# Patient Record
Sex: Female | Born: 1985 | Race: White | Hispanic: Yes | Smoking: Current some day smoker
Health system: Southern US, Community
[De-identification: ages and names within clinical notes are randomized; demographics above are authoritative.]

---

## 2018-05-26 ENCOUNTER — Encounter (HOSPITAL_COMMUNITY): Payer: Self-pay | Admitting: Emergency Medicine

## 2018-05-26 ENCOUNTER — Emergency Department (HOSPITAL_COMMUNITY)
Admission: EM | Admit: 2018-05-26 | Discharge: 2018-05-27 | Disposition: A | Discharge status: Home / Self Care | Payer: Medicaid Other | Attending: Emergency Medicine | Admitting: Emergency Medicine

## 2018-05-26 DIAGNOSIS — R55 Syncope and collapse: Secondary | ICD-10-CM | POA: Insufficient documentation

## 2018-05-26 DIAGNOSIS — Y9389 Activity, other specified: Secondary | ICD-10-CM | POA: Insufficient documentation

## 2018-05-26 DIAGNOSIS — Y999 Unspecified external cause status: Secondary | ICD-10-CM | POA: Diagnosis not present

## 2018-05-26 DIAGNOSIS — Y929 Unspecified place or not applicable: Secondary | ICD-10-CM | POA: Insufficient documentation

## 2018-05-26 DIAGNOSIS — M542 Cervicalgia: Secondary | ICD-10-CM | POA: Diagnosis present

## 2018-05-26 LAB — BASIC METABOLIC PANEL
Anion gap: 7 (ref 5–15)
BUN: 11 mg/dL (ref 6–20)
CHLORIDE: 107 mmol/L (ref 98–111)
CO2: 24 mmol/L (ref 22–32)
Calcium: 9.2 mg/dL (ref 8.9–10.3)
Creatinine, Ser: 0.61 mg/dL (ref 0.44–1.00)
GFR calc Af Amer: >60 mL/min (ref >60)
GFR calc non Af Amer: >60 mL/min (ref >60)
GLUCOSE: 92 mg/dL (ref 70–99)
POTASSIUM: 4.2 mmol/L (ref 3.5–5.1)
Sodium: 138 mmol/L (ref 135–145)

## 2018-05-26 LAB — CBC WITH DIFFERENTIAL/PLATELET
Abs Immature Granulocytes: 0 10*3/uL (ref 0.0–0.1)
Basophils Absolute: 0 10*3/uL (ref 0.0–0.1)
Basophils Relative: 0 %
EOS PCT: 1 %
Eosinophils Absolute: 0 10*3/uL (ref 0.0–0.7)
HEMATOCRIT: 40.6 % (ref 36.0–46.0)
HEMOGLOBIN: 13.3 g/dL (ref 12.0–15.0)
Immature Granulocytes: 0 %
LYMPHS ABS: 2.6 10*3/uL (ref 0.7–4.0)
Lymphocytes Relative: 29 %
MCH: 32.2 pg (ref 26.0–34.0)
MCHC: 32.8 g/dL (ref 30.0–36.0)
MCV: 98.3 fL (ref 78.0–100.0)
Monocytes Absolute: 0.8 10*3/uL (ref 0.1–1.0)
Monocytes Relative: 9 %
NEUTROS ABS: 5.3 10*3/uL (ref 1.7–7.7)
Neutrophils Relative %: 61 %
Platelets: 220 10*3/uL (ref 150–400)
RBC: 4.13 MIL/uL (ref 3.87–5.11)
RDW: 13.4 % (ref 11.5–15.5)
WBC: 8.7 10*3/uL (ref 4.0–10.5)

## 2018-05-26 LAB — I-STAT BETA HCG BLOOD, ED (MC, WL, AP ONLY): I-stat hCG, quantitative: <5 m[IU]/mL (ref <5)
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
## 2018-05-27 ENCOUNTER — Emergency Department (HOSPITAL_COMMUNITY): Payer: Medicaid Other

## 2018-05-27 NOTE — Discharge Instructions (Signed)
Return to ED for worsening symptoms, severe neck pain, headache, chest pain, loss of consciousness, trouble breathing or trouble swallowing.

## 2018-12-29 ENCOUNTER — Ambulatory Visit (HOSPITAL_COMMUNITY)
Admission: EM | Admit: 2018-12-29 | Discharge: 2018-12-29 | Discharge status: Against Medical Advice | Payer: Medicaid Other

## 2018-12-29 NOTE — ED Notes (Signed)
Per pt access pt lwbs 

## 2019-11-17 ENCOUNTER — Emergency Department (HOSPITAL_COMMUNITY): Payer: Medicaid Other

## 2019-11-17 ENCOUNTER — Ambulatory Visit (HOSPITAL_COMMUNITY)
Admission: EM | Admit: 2019-11-17 | Discharge: 2019-11-17 | Disposition: A | Discharge status: Home / Self Care | Payer: Medicaid Other

## 2019-11-17 ENCOUNTER — Other Ambulatory Visit: Payer: Self-pay

## 2019-11-17 ENCOUNTER — Encounter (HOSPITAL_COMMUNITY): Payer: Self-pay | Admitting: Emergency Medicine

## 2019-11-17 ENCOUNTER — Emergency Department (HOSPITAL_COMMUNITY)
Admission: EM | Admit: 2019-11-17 | Discharge: 2019-11-17 | Disposition: A | Discharge status: Home / Self Care | Payer: Medicaid Other | Attending: Emergency Medicine | Admitting: Emergency Medicine

## 2019-11-17 ENCOUNTER — Encounter (HOSPITAL_COMMUNITY): Payer: Self-pay

## 2019-11-17 DIAGNOSIS — S0993XA Unspecified injury of face, initial encounter: Secondary | ICD-10-CM

## 2019-11-17 DIAGNOSIS — J349 Unspecified disorder of nose and nasal sinuses: Secondary | ICD-10-CM | POA: Insufficient documentation

## 2019-11-17 DIAGNOSIS — F17290 Nicotine dependence, other tobacco product, uncomplicated: Secondary | ICD-10-CM | POA: Diagnosis not present

## 2019-11-17 DIAGNOSIS — S0012XA Contusion of left eyelid and periocular area, initial encounter: Secondary | ICD-10-CM | POA: Diagnosis not present

## 2019-11-17 DIAGNOSIS — R42 Dizziness and giddiness: Secondary | ICD-10-CM | POA: Diagnosis not present

## 2019-11-17 DIAGNOSIS — S0592XA Unspecified injury of left eye and orbit, initial encounter: Secondary | ICD-10-CM | POA: Diagnosis present

## 2019-11-17 DIAGNOSIS — S022XXA Fracture of nasal bones, initial encounter for closed fracture: Secondary | ICD-10-CM | POA: Diagnosis not present

## 2019-11-17 DIAGNOSIS — J3489 Other specified disorders of nose and nasal sinuses: Secondary | ICD-10-CM

## 2019-11-17 DIAGNOSIS — R519 Headache, unspecified: Secondary | ICD-10-CM | POA: Insufficient documentation

## 2019-11-17 DIAGNOSIS — Y999 Unspecified external cause status: Secondary | ICD-10-CM | POA: Insufficient documentation

## 2019-11-17 DIAGNOSIS — Y929 Unspecified place or not applicable: Secondary | ICD-10-CM | POA: Diagnosis not present

## 2019-11-17 DIAGNOSIS — Y9389 Activity, other specified: Secondary | ICD-10-CM | POA: Insufficient documentation

## 2019-11-17 MED ORDER — ONDANSETRON HCL 4 MG PO TABS
4.0000 mg | ORAL_TABLET | Freq: Once | ORAL | Status: AC
  Administered 2019-11-17: 4 mg via ORAL
  Filled 2019-11-17: qty 1

## 2019-11-17 MED ORDER — TETRACAINE HCL 0.5 % OP SOLN
2.0000 [drp] | Freq: Once | OPHTHALMIC | Status: AC
  Administered 2019-11-17: 2 [drp] via OPHTHALMIC
  Filled 2019-11-17: qty 4

## 2019-11-17 MED ORDER — ONDANSETRON HCL 4 MG PO TABS: 4.0000 mg | ORAL_TABLET | Freq: Three times a day (TID) | ORAL | 0 refills | Status: AC | PRN

## 2019-11-17 MED ORDER — OXYCODONE-ACETAMINOPHEN 5-325 MG PO TABS: 1.0000 | ORAL_TABLET | Freq: Four times a day (QID) | ORAL | 0 refills | Status: AC | PRN

## 2019-11-17 MED ORDER — MECLIZINE HCL 25 MG PO TABS
25.0000 mg | ORAL_TABLET | Freq: Three times a day (TID) | ORAL | 0 refills | Status: AC | PRN
Start: 2019-11-17 — End: ?

## 2019-11-17 MED ORDER — FLUORESCEIN SODIUM 1 MG OP STRP
1.0000 | ORAL_STRIP | Freq: Once | OPHTHALMIC | Status: AC
  Administered 2019-11-17: 1 via OPHTHALMIC
  Filled 2019-11-17: qty 1

## 2019-11-17 MED ORDER — AFRIN NASAL SPRAY 0.05 % NA SOLN: 1.0000 | Freq: Two times a day (BID) | NASAL | 0 refills | Status: AC

## 2019-11-17 MED ORDER — ONDANSETRON HCL 4 MG/2ML IJ SOLN: 4.0000 mg | Freq: Once | INTRAMUSCULAR | Status: DC

## 2019-11-17 MED ORDER — OXYCODONE-ACETAMINOPHEN 5-325 MG PO TABS
1.0000 | ORAL_TABLET | Freq: Once | ORAL | Status: AC
  Administered 2019-11-17: 1 via ORAL
  Filled 2019-11-17: qty 1
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
## 2019-11-17 NOTE — ED Notes (Signed)
Visual acuity completed @ urgent care. Per note pt normally wears corrective lenses but does not have them for exam.  L 20/70 R 20/50 B 20/40
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
## 2019-11-17 NOTE — Discharge Instructions (Addendum)
Get help right away if: You have bleeding from your nose that does not stop after you pinch your nostrils closed for 20 minutes and keep ice on your nose. You have clear fluid draining out of your nose. You notice swelling near the septum inside the nose. This swelling is a septal hematoma that must be drained to help prevent infection. You have difficulty moving your eyes. You have repeated vomiting.
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
## 2019-11-17 NOTE — ED Notes (Signed)
Pt normally wears corrective lenses for distance vision, but did not bring them with her today. Visual acuity completed.
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
## 2020-03-23 ENCOUNTER — Ambulatory Visit: Payer: Medicaid Other | Attending: Internal Medicine

## 2020-03-23 DIAGNOSIS — Z23 Encounter for immunization: Secondary | ICD-10-CM

## 2020-03-23 NOTE — Progress Notes (Signed)
   Covid-19 Vaccination Clinic  Name:  Reaghan Kawa    MRN: [id number] DOB: Oct 12, 1986  03/23/2020  Ms. Muraoka was observed post Covid-19 immunization for 15 minutes without incident. She was provided with Vaccine Information Sheet and instruction to access the V-Safe system.   Ms. Fichter was instructed to call 911 with any severe reactions post vaccine: Marland Kitchen Difficulty breathing  . Swelling of face and throat  . A fast heartbeat  . A bad rash all over body  . Dizziness and weakness   Immunizations Administered    Name Date Dose VIS Date Route   Pfizer COVID-19 Vaccine 03/23/2020 11:06 AM 0.3 mL 12/22/2018 Intramuscular   Manufacturer: [employer], [employer]   Lot: [id number]   NDC: [address]-4

## 2020-04-17 ENCOUNTER — Ambulatory Visit: Payer: Medicaid Other | Attending: Internal Medicine

## 2020-04-17 DIAGNOSIS — Z23 Encounter for immunization: Secondary | ICD-10-CM

## 2020-04-17 NOTE — Progress Notes (Signed)
   Covid-19 Vaccination Clinic  Name:  Maria Spence    MRN: [id number] DOB: 1986-01-24  04/17/2020  [name] was observed post Covid-19 immunization for 15 minutes without incident. She was provided with Vaccine Information Sheet and instruction to access the V-Safe system.   [name] was instructed to call 911 with any severe reactions post vaccine: Marland Kitchen Difficulty breathing  . Swelling of face and throat  . A fast heartbeat  . A bad rash all over body  . Dizziness and weakness   Immunizations Administered    Name Date Dose VIS Date Route   Pfizer COVID-19 Vaccine 04/17/2020 11:08 AM 0.3 mL 12/22/2018 Intramuscular   Manufacturer: [employer], [employer]   Lot: [id number]   NDC: [address]-1

## 2020-12-31 ENCOUNTER — Encounter (HOSPITAL_COMMUNITY): Payer: Self-pay | Admitting: Emergency Medicine

## 2020-12-31 ENCOUNTER — Emergency Department (HOSPITAL_COMMUNITY)
Admission: EM | Admit: 2020-12-31 | Discharge: 2020-12-31 | Disposition: A | Discharge status: Home / Self Care | Payer: Medicaid Other | Attending: Emergency Medicine | Admitting: Emergency Medicine

## 2020-12-31 ENCOUNTER — Other Ambulatory Visit: Payer: Self-pay

## 2020-12-31 ENCOUNTER — Emergency Department (HOSPITAL_COMMUNITY): Payer: Medicaid Other

## 2020-12-31 DIAGNOSIS — W06XXXA Fall from bed, initial encounter: Secondary | ICD-10-CM | POA: Insufficient documentation

## 2020-12-31 DIAGNOSIS — S80211A Abrasion, right knee, initial encounter: Secondary | ICD-10-CM | POA: Diagnosis not present

## 2020-12-31 DIAGNOSIS — F17290 Nicotine dependence, other tobacco product, uncomplicated: Secondary | ICD-10-CM | POA: Diagnosis not present

## 2020-12-31 DIAGNOSIS — W19XXXA Unspecified fall, initial encounter: Secondary | ICD-10-CM

## 2020-12-31 DIAGNOSIS — Z23 Encounter for immunization: Secondary | ICD-10-CM | POA: Insufficient documentation

## 2020-12-31 DIAGNOSIS — S8991XA Unspecified injury of right lower leg, initial encounter: Secondary | ICD-10-CM | POA: Diagnosis present

## 2020-12-31 DIAGNOSIS — M25561 Pain in right knee: Secondary | ICD-10-CM

## 2020-12-31 MED ORDER — HYDROCODONE-ACETAMINOPHEN 5-325 MG PO TABS
1.0000 | ORAL_TABLET | Freq: Once | ORAL | Status: AC
  Administered 2020-12-31: 1 via ORAL
  Filled 2020-12-31: qty 1

## 2020-12-31 MED ORDER — TETANUS-DIPHTH-ACELL PERTUSSIS 5-2.5-18.5 LF-MCG/0.5 IM SUSY
0.5000 mL | PREFILLED_SYRINGE | Freq: Once | INTRAMUSCULAR | Status: AC
  Administered 2020-12-31: 0.5 mL via INTRAMUSCULAR
  Filled 2020-12-31: qty 0.5

## 2020-12-31 NOTE — ED Triage Notes (Signed)
Pt tripped and fell when she got out of bed and hit R knee on metal.  C/o R knee pain.

## 2020-12-31 NOTE — Progress Notes (Signed)
Orthopedic Tech Progress Note Patient Details:  Maria Spence 1986-07-30 [id number]  Ortho Devices Type of Ortho Device: Knee Sleeve,Crutches Ortho Device/Splint Location: Right Lower Extremity Ortho Device/Splint Interventions: Ordered,Application,Adjustment   Post Interventions Patient Tolerated: Well Instructions Provided: Poper ambulation with device,Adjustment of device,Care of device   Maria Spence P Harle Stanford 12/31/2020, 2:25 PM

## 2020-12-31 NOTE — Discharge Instructions (Signed)
It was our pleasure taking care of you here in the emergency department today  X-ray did not show any evidence of broken bones or dislocation  Take Tylenol and ibuprofen as needed for pain.  Make sure to elevate and place ice on your knee.  If you still unable to walk after a few days without crutches follow-up with orthopedics.  Their contact information is listed in your discharge paperwork.  Call them to schedule appointment.  Return for any worsening symptoms

## 2020-12-31 NOTE — ED Provider Notes (Signed)
[facility] EMERGENCY DEPARTMENT Provider Note   CSN: [id number] Arrival date & time: 12/31/20  1012     History Chief Complaint  Patient presents with  . Knee Pain    Maria Spence is a 35 y.o. female with past medical history who presents for evaluation of right knee pain.  Tripped and fell getting out of bed.  Denies hitting head, LOC or anticoagulation.  Hit lateral, anterior aspect knee on a metal object.  Has approximately 3 mm abrasion to anterior knee.  Has some overlying swelling.  She has pain when she walks.  No fever, chills, nausea vomiting, chest pain, shortness of breath, paresthesias or weakness.  Rates her current pain a 9/10.  Denies additional aggravating or alleviating factors.  History obtained from patient and past medical records.  No interpreter used.  HPI     History reviewed. No pertinent past medical history.  There are no problems to display for this patient.   History reviewed. No pertinent surgical history.   OB History   No obstetric history on file.     Family History  Problem Relation Age of Onset  . Diabetes Mother   . Hypertension Mother   . Diabetes Father     Social History   Tobacco Use  . Smoking status: Current Some Day Smoker    Types: Cigars  . Smokeless tobacco: Never Used  Vaping Use  . Vaping Use: Never used  Substance Use Topics  . Alcohol use: Yes    Comment: weekly  . Drug use: Never    Home Medications Prior to Admission medications   Medication Sig Start Date End Date Taking? Authorizing Provider  meclizine (ANTIVERT) 25 MG tablet Take 1 tablet (25 mg total) by mouth 3 (three) times daily as needed for dizziness. 11/17/19   Harris, Cammy Copa, PA-C  ondansetron (ZOFRAN) 4 MG tablet Take 1 tablet (4 mg total) by mouth every 8 (eight) hours as needed for nausea or vomiting. 11/17/19   Arthor Captain, PA-C  oxyCODONE-acetaminophen (PERCOCET) 5-325 MG tablet Take 1-2 tablets by mouth every 6 (six)  hours as needed for severe pain. 11/17/19   Arthor Captain, PA-C    Allergies    Patient has no known allergies.  Review of Systems   Review of Systems  Constitutional: Negative.   HENT: Negative.   Respiratory: Negative.   Cardiovascular: Negative.   Gastrointestinal: Negative.   Genitourinary: Negative.   Musculoskeletal: Positive for gait problem.       Right knee pain.  Walk with a limp secondary to pain  Skin: Positive for wound.  All other systems reviewed and are negative.   Physical Exam Updated Vital Signs BP 118/76 (BP Location: Right Arm)   Pulse 94   Temp 98.6 F (37 C) (Oral)   Resp 12   LMP 12/24/2020   SpO2 98%   Physical Exam Vitals and nursing note reviewed.  Constitutional:      General: She is not in acute distress.    Appearance: She is well-developed and well-nourished. She is not ill-appearing, toxic-appearing or diaphoretic.  HENT:     Head: Normocephalic and atraumatic.     Nose: Nose normal.     Mouth/Throat:     Mouth: Mucous membranes are moist.  Eyes:     Pupils: Pupils are equal, round, and reactive to light.  Cardiovascular:     Rate and Rhythm: Normal rate.     Pulses: Intact distal pulses.  Pulmonary:  Effort: Pulmonary effort is normal. No respiratory distress.     Comments: Speaks in full sentences without difficulty Abdominal:     General: Bowel sounds are normal. There is no distension.  Musculoskeletal:        General: Swelling, tenderness and signs of injury present. Normal range of motion.     Cervical back: Normal range of motion.     Comments: Tenderness, soft tissue swelling diffusely to right knee.  She is able to flex and extend without difficulty.  Able to straight leg raise.  No bony tenderness to bilateral femur, tib-fib.  No shortening or rotation of legs.  No obvious effusion.  Skin:    General: Skin is warm and dry.     Capillary Refill: Capillary refill takes less than 2 seconds.     Comments: Soft tissue  swelling diffusely to right anterior knee.  3 mm abrasion.  Neurological:     General: No focal deficit present.     Mental Status: She is alert and oriented to person, place, and time.     Comments: Intact sensation, equal strength Gait with limp secondary to pain  Psychiatric:        Mood and Affect: Mood and affect normal.     ED Results / Procedures / Treatments   Labs (all labs ordered are listed, but only abnormal results are displayed) Labs Reviewed - No data to display  EKG None  Radiology DG Knee Complete 4 Views Right  Result Date: 12/31/2020 CLINICAL DATA:  Fall EXAM: RIGHT KNEE - COMPLETE 4+ VIEW COMPARISON:  None. FINDINGS: No acute fracture or dislocation. Joint spaces and alignment are maintained. No area of erosion or osseous destruction. No unexpected radiopaque foreign body. Soft tissues are unremarkable. IMPRESSION: Negative. Electronically Signed   By: Meda Klinefelter MD   On: 12/31/2020 11:48    Procedures .Ortho Injury Treatment  Date/Time: 12/31/2020 12:47 PM Performed by: Linwood Dibbles, PA-C Authorized by: Linwood Dibbles, PA-C   Consent:    Consent obtained:  Verbal   Consent given by:  Patient   Risks discussed:  Fracture, nerve damage, restricted joint movement, vascular damage, stiffness, irreducible dislocation and recurrent dislocation   Alternatives discussed:  No treatment, alternative treatment, immobilization, referral and delayed treatmentInjury location: knee Location details: right knee Injury type: soft tissue Pre-procedure neurovascular assessment: neurovascularly intact Pre-procedure distal perfusion: normal Pre-procedure neurological function: normal Pre-procedure range of motion: normal  Anesthesia: Local anesthesia used: no  Patient sedated: NoImmobilization: splint and crutches     Medications Ordered in ED Medications  HYDROcodone-acetaminophen (NORCO/VICODIN) 5-325 MG per tablet 1 tablet (1 tablet Oral Given  12/31/20 1106)  Tdap (BOOSTRIX) injection 0.5 mL (0.5 mLs Intramuscular Given 12/31/20 1107)    ED Course  I have reviewed the triage vital signs and the nursing notes.  Pertinent labs & imaging results that were available during my care of the patient were reviewed by me and considered in my medical decision making (see chart for details).   35 year old here with right knee pain after getting out of bed and subsequently hitting on metal object at bedside.  She is afebrile, nonseptic, non-ill-appearing.  Diffuse tenderness to right knee.  She is neurovascularly intact.  Able to flex, extend, straight leg raise.  No bony tenderness of bilateral femur, tib-fib.  Does have a small, 3 mm abrasion.  Unknown last tetanus, will update.  We will plan on pain management, x-ray.  X-ray with  Low suspicion for septic joint,  gout, hemarthrosis, occult fracture, dislocation, myositis, rhabdomyolysis, VTE.  Will DC home with crutches, splint.  Discussed rotation Tylenol, ibuprofen, ice and elevation.  Follow with orthopedics for any worsening symptoms.  The patient has been appropriately medically screened and/or stabilized in the ED. I have low suspicion for any other emergent medical condition which would require further screening, evaluation or treatment in the ED or require inpatient management.  Patient is hemodynamically stable and in no acute distress.  Patient able to ambulate in department prior to ED.  Evaluation does not show acute pathology that would require ongoing or additional emergent interventions while in the emergency department or further inpatient treatment.  I have discussed the diagnosis with the patient and answered all questions.  Pain is been managed while in the emergency department and patient has no further complaints prior to discharge.  Patient is comfortable with plan discussed in room and is stable for discharge at this time.  I have discussed strict return precautions for returning  to the emergency department.  Patient was encouraged to follow-up with PCP/specialist refer to at discharge.    MDM Rules/Calculators/A&P                           Final Clinical Impression(s) / ED Diagnoses Final diagnoses:  Acute pain of right knee  Fall, initial encounter    Rx / DC Orders ED Discharge Orders    None       Inessa Wardrop A, PA-C 12/31/20 1248    Tegeler, Canary Brim, MD 12/31/20 [phone]

## 2021-08-07 ENCOUNTER — Ambulatory Visit: Payer: Self-pay

## 2021-08-27 ENCOUNTER — Emergency Department (HOSPITAL_COMMUNITY)
Admission: EM | Admit: 2021-08-27 | Discharge: 2021-08-28 | Disposition: A | Discharge status: Home / Self Care | Payer: Medicaid Other | Attending: Emergency Medicine | Admitting: Emergency Medicine

## 2021-08-27 ENCOUNTER — Other Ambulatory Visit: Payer: Self-pay

## 2021-08-27 DIAGNOSIS — N939 Abnormal uterine and vaginal bleeding, unspecified: Secondary | ICD-10-CM | POA: Diagnosis present

## 2021-08-27 DIAGNOSIS — N9489 Other specified conditions associated with female genital organs and menstrual cycle: Secondary | ICD-10-CM | POA: Diagnosis not present

## 2021-08-27 DIAGNOSIS — R1030 Lower abdominal pain, unspecified: Secondary | ICD-10-CM | POA: Insufficient documentation

## 2021-08-27 DIAGNOSIS — Y92410 Unspecified street and highway as the place of occurrence of the external cause: Secondary | ICD-10-CM | POA: Insufficient documentation

## 2021-08-27 DIAGNOSIS — F17290 Nicotine dependence, other tobacco product, uncomplicated: Secondary | ICD-10-CM | POA: Diagnosis not present

## 2021-08-27 LAB — COMPREHENSIVE METABOLIC PANEL
ALT: 30 U/L (ref 0–44)
AST: 21 U/L (ref 15–41)
Albumin: 3.8 g/dL (ref 3.5–5.0)
Alkaline Phosphatase: 81 U/L (ref 38–126)
Anion gap: 5 (ref 5–15)
BUN: 11 mg/dL (ref 6–20)
CO2: 24 mmol/L (ref 22–32)
Calcium: 8.9 mg/dL (ref 8.9–10.3)
Chloride: 109 mmol/L (ref 98–111)
Creatinine, Ser: 0.78 mg/dL (ref 0.44–1.00)
GFR, Estimated: >60 mL/min (ref >60)
Glucose, Bld: 67 mg/dL — ABNORMAL LOW (ref 70–99)
Potassium: 4.2 mmol/L (ref 3.5–5.1)
Sodium: 138 mmol/L (ref 135–145)
Total Bilirubin: 0.7 mg/dL (ref 0.3–1.2)
Total Protein: 7 g/dL (ref 6.5–8.1)

## 2021-08-27 LAB — I-STAT BETA HCG BLOOD, ED (MC, WL, AP ONLY): I-stat hCG, quantitative: <5 m[IU]/mL (ref <5)

## 2021-08-27 LAB — CBC
HCT: 38.4 % (ref 36.0–46.0)
Hemoglobin: 12.9 g/dL (ref 12.0–15.0)
MCH: 33.8 pg (ref 26.0–34.0)
MCHC: 33.6 g/dL (ref 30.0–36.0)
MCV: 100.5 fL — ABNORMAL HIGH (ref 80.0–100.0)
Platelets: 253 10*3/uL (ref 150–400)
RBC: 3.82 MIL/uL — ABNORMAL LOW (ref 3.87–5.11)
RDW: 13.8 % (ref 11.5–15.5)
WBC: 8.6 10*3/uL (ref 4.0–10.5)
nRBC: 0 % (ref 0.0–0.2)

## 2021-08-27 LAB — LIPASE, BLOOD: Lipase: 28 U/L (ref 11–51)

## 2021-08-27 MED ORDER — ACETAMINOPHEN 325 MG PO TABS
650.0000 mg | ORAL_TABLET | Freq: Once | ORAL | Status: AC
Start: 2021-08-27 — End: 2021-08-28
  Administered 2021-08-28: 650 mg via ORAL
  Filled 2021-08-27 (×2): qty 2
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
## 2021-08-28 ENCOUNTER — Emergency Department (HOSPITAL_COMMUNITY): Payer: Medicaid Other

## 2021-08-28 ENCOUNTER — Encounter (HOSPITAL_COMMUNITY): Payer: Self-pay | Admitting: Radiology

## 2021-08-28 LAB — URINALYSIS, ROUTINE W REFLEX MICROSCOPIC
Bilirubin Urine: NEGATIVE
Glucose, UA: NEGATIVE mg/dL
Ketones, ur: NEGATIVE mg/dL
Nitrite: NEGATIVE
Protein, ur: NEGATIVE mg/dL
Specific Gravity, Urine: 1.023 (ref 1.005–1.030)
pH: 5 (ref 5.0–8.0)

## 2021-08-28 MED ORDER — IOHEXOL 300 MG/ML  SOLN
100.0000 mL | Freq: Once | INTRAMUSCULAR | Status: AC | PRN
  Administered 2021-08-28: 100 mL via INTRAVENOUS
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
## 2021-08-28 NOTE — ED Notes (Signed)
Patient transported to CT 

## 2022-04-04 IMAGING — DX DG KNEE COMPLETE 4+V*R*
4 series · 4 of 4 positions shown · non-contrast
Comparison: None.

CLINICAL DATA: Fall

EXAM:
RIGHT KNEE - COMPLETE 4+ VIEW

[knee ap]
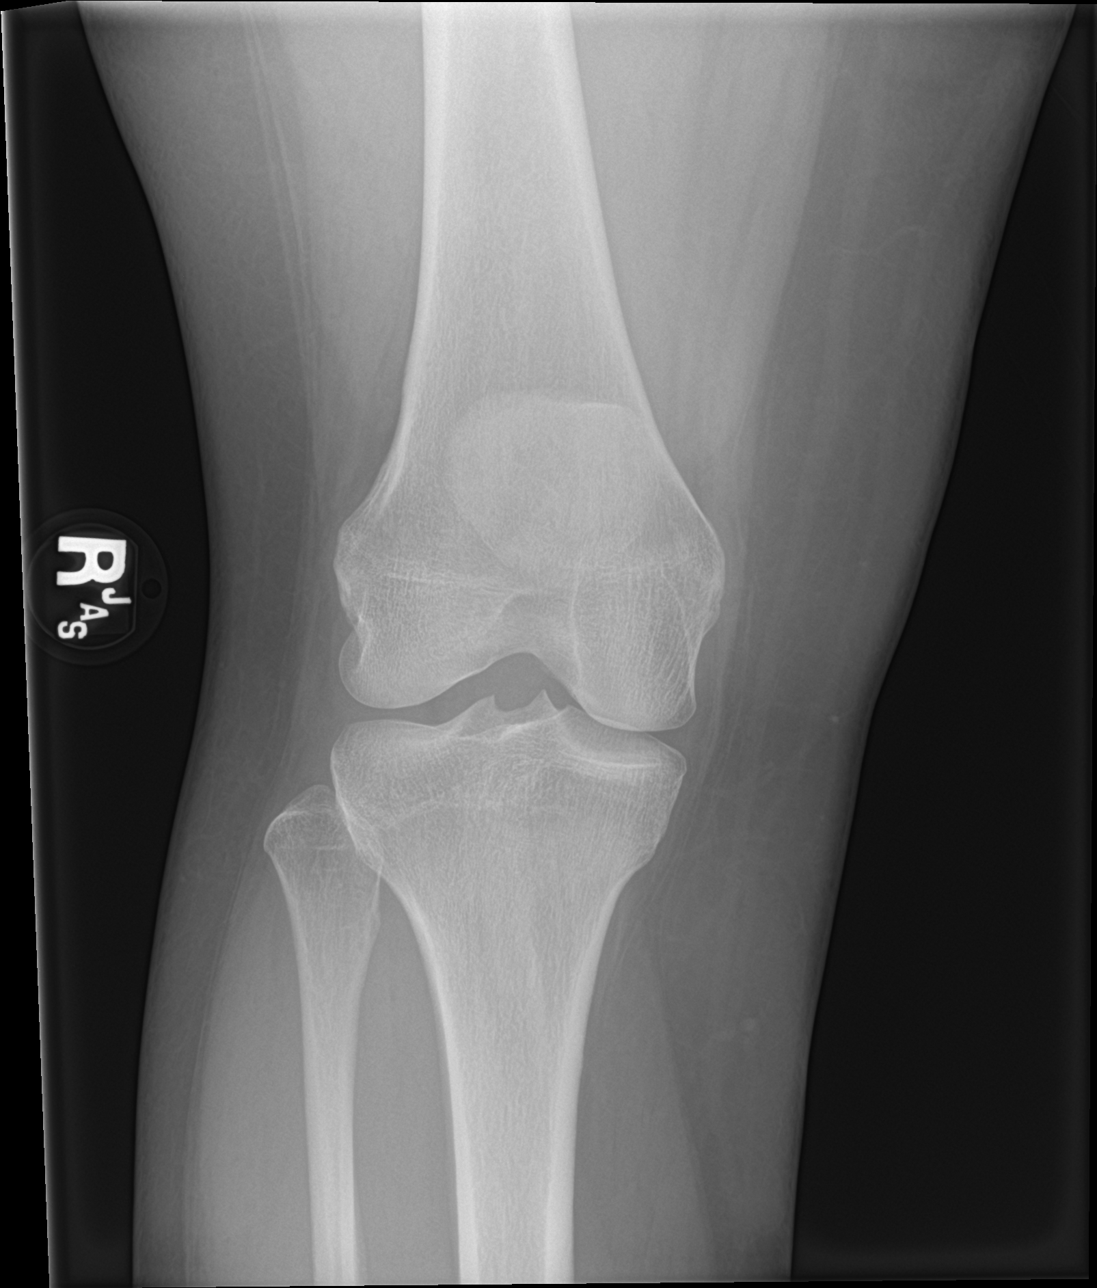

[knee lat]
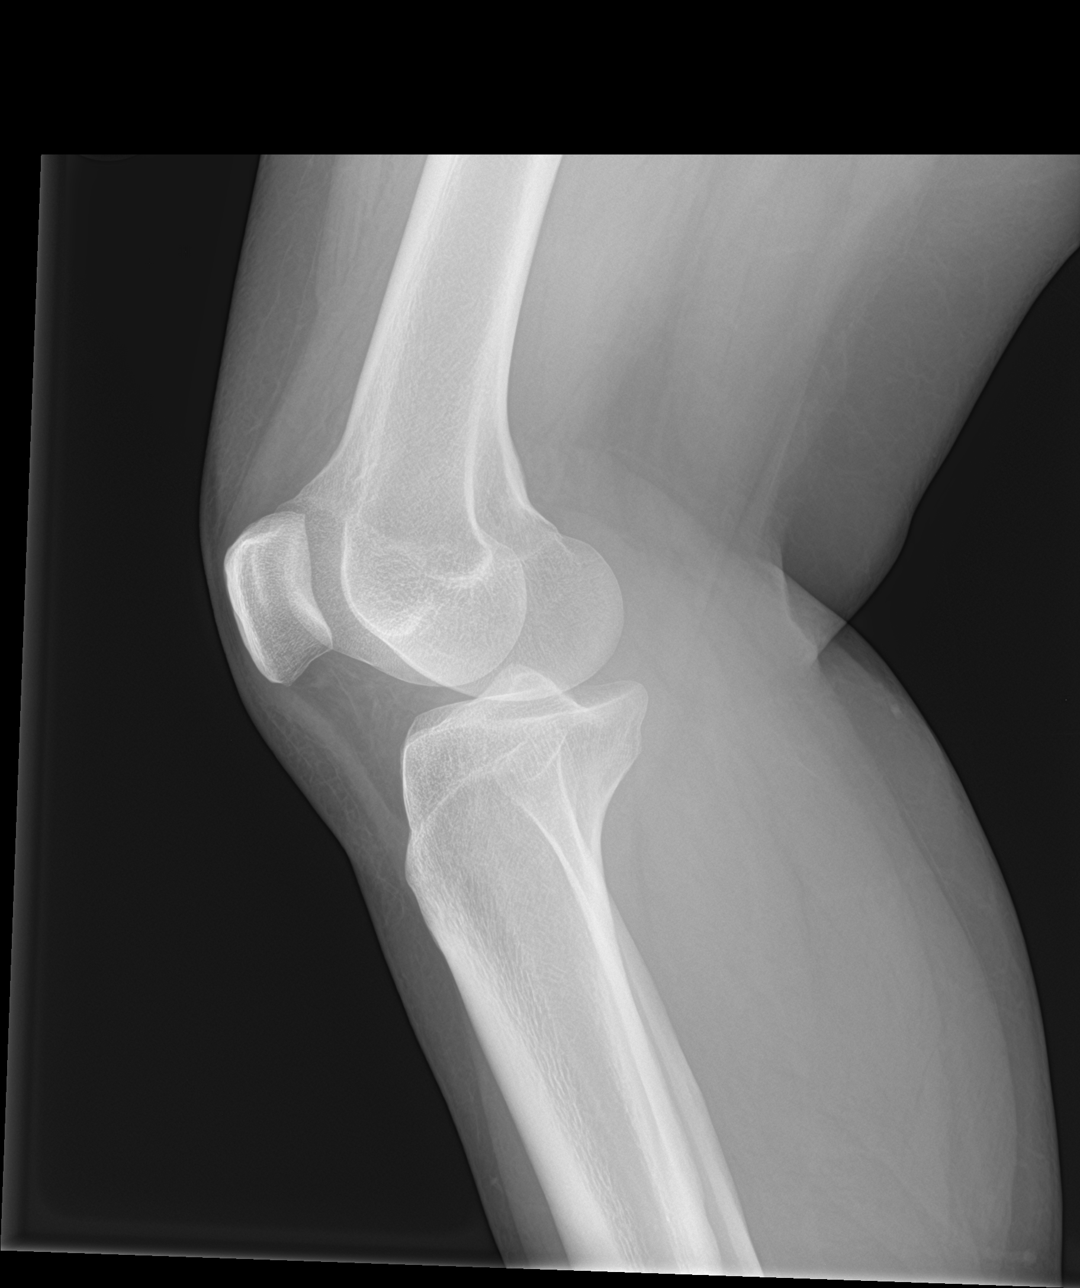

[knee obl (1 of 2)]
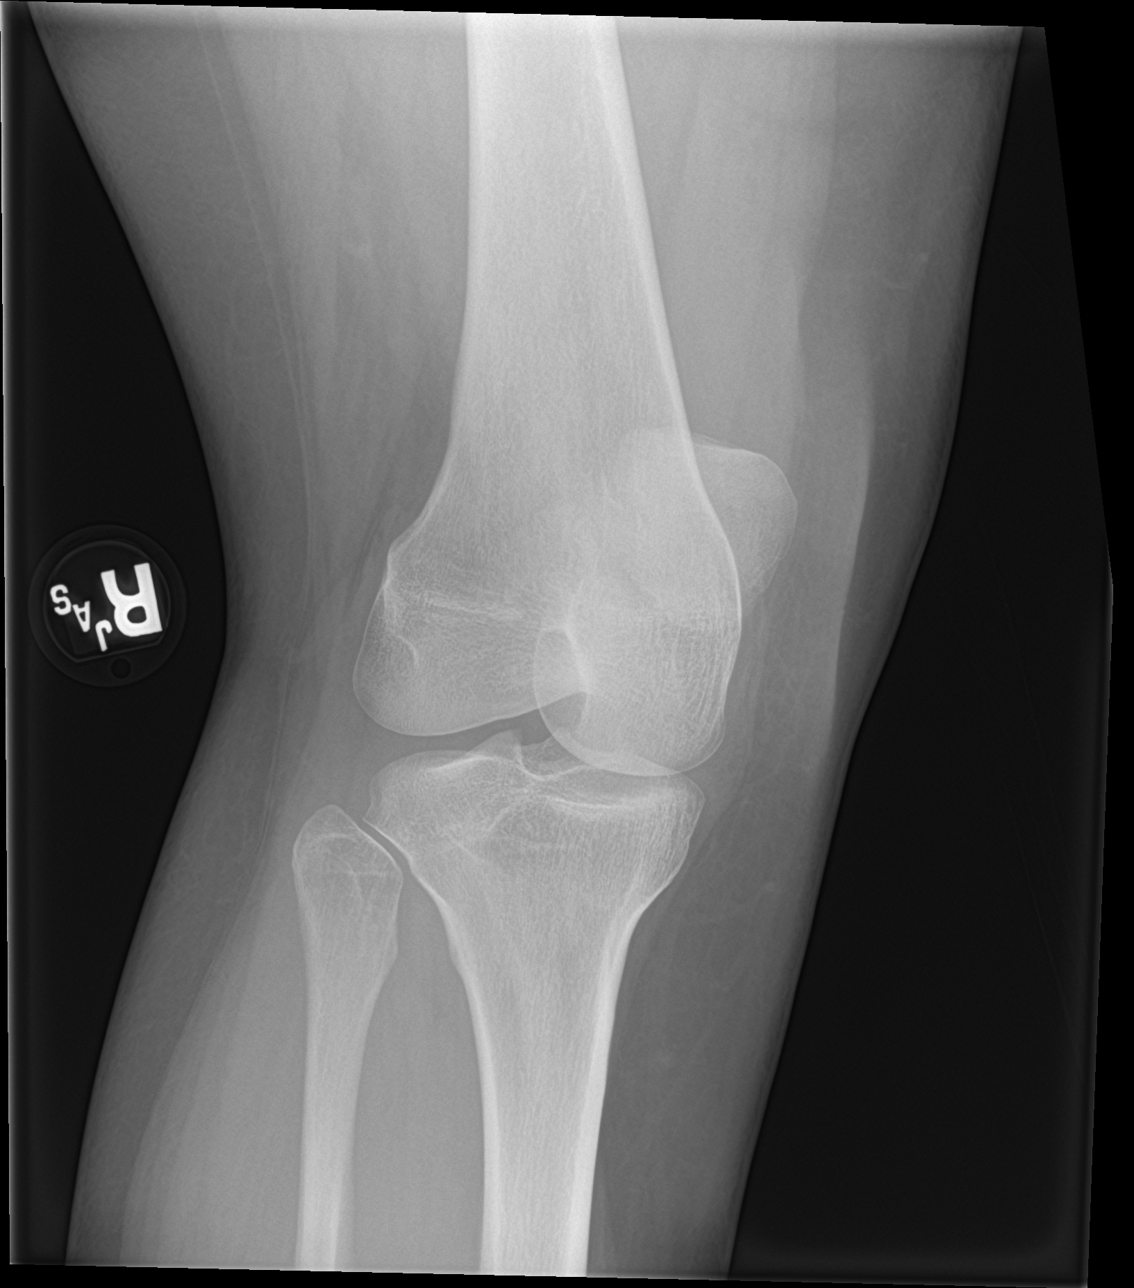

[knee obl (2 of 2)]
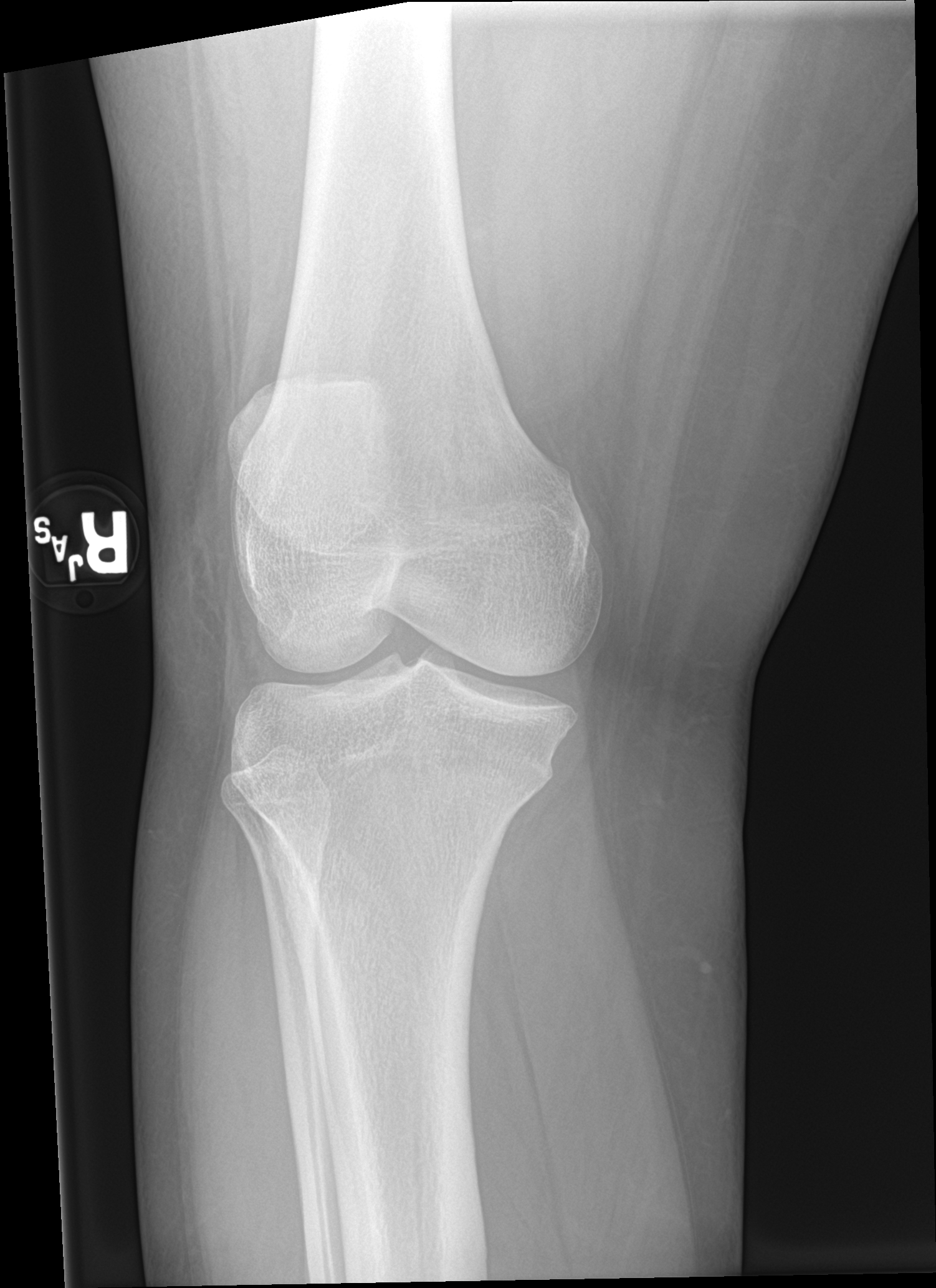

[4 of 4 positions shown; findings below may reference images not displayed]

FINDINGS: No acute fracture or dislocation. Joint spaces and alignment are
maintained. No area of erosion or osseous destruction. No unexpected
radiopaque foreign body. Soft tissues are unremarkable.
IMPRESSION: Negative.
